# Patient Record
Sex: Female | Born: 1950 | Race: White | Hispanic: No | Smoking: Never smoker
Health system: Southern US, Community
[De-identification: ages and names within clinical notes are randomized; demographics above are authoritative.]

## PROBLEM LIST (undated history)

## (undated) DIAGNOSIS — C9000 Multiple myeloma not having achieved remission: Secondary | ICD-10-CM

---

## 2014-12-04 ENCOUNTER — Emergency Department (HOSPITAL_COMMUNITY): Payer: Medicare Other

## 2014-12-04 ENCOUNTER — Encounter (HOSPITAL_COMMUNITY): Payer: Self-pay | Admitting: *Deleted

## 2014-12-04 ENCOUNTER — Emergency Department (HOSPITAL_COMMUNITY)
Admission: EM | Admit: 2014-12-04 | Discharge: 2014-12-04 | Disposition: A | Discharge status: Home / Self Care | Payer: Medicare Other | Attending: Emergency Medicine | Admitting: Emergency Medicine

## 2014-12-04 DIAGNOSIS — Z8579 Personal history of other malignant neoplasms of lymphoid, hematopoietic and related tissues: Secondary | ICD-10-CM | POA: Diagnosis not present

## 2014-12-04 DIAGNOSIS — Z7982 Long term (current) use of aspirin: Secondary | ICD-10-CM | POA: Diagnosis not present

## 2014-12-04 DIAGNOSIS — R140 Abdominal distension (gaseous): Secondary | ICD-10-CM | POA: Diagnosis not present

## 2014-12-04 DIAGNOSIS — N390 Urinary tract infection, site not specified: Secondary | ICD-10-CM | POA: Diagnosis not present

## 2014-12-04 DIAGNOSIS — Z79899 Other long term (current) drug therapy: Secondary | ICD-10-CM | POA: Insufficient documentation

## 2014-12-04 DIAGNOSIS — R350 Frequency of micturition: Secondary | ICD-10-CM | POA: Diagnosis present

## 2014-12-04 HISTORY — DX: Multiple myeloma not having achieved remission: C90.00

## 2014-12-04 LAB — CBC WITH DIFFERENTIAL/PLATELET
Basophils Absolute: 0.1 10*3/uL (ref 0.0–0.1)
Basophils Relative: 1 %
EOS ABS: 0.2 10*3/uL (ref 0.0–0.7)
EOS PCT: 3 %
HCT: 34.3 % — ABNORMAL LOW (ref 36.0–46.0)
Hemoglobin: 11.6 g/dL — ABNORMAL LOW (ref 12.0–15.0)
LYMPHS ABS: 0.4 10*3/uL — AB (ref 0.7–4.0)
Lymphocytes Relative: 8 %
MCH: 33.9 pg (ref 26.0–34.0)
MCHC: 33.8 g/dL (ref 30.0–36.0)
MCV: 100.3 fL — ABNORMAL HIGH (ref 78.0–100.0)
MONO ABS: 0.9 10*3/uL (ref 0.1–1.0)
Monocytes Relative: 15 %
Neutro Abs: 4.4 10*3/uL (ref 1.7–7.7)
Neutrophils Relative %: 75 %
PLATELETS: 166 10*3/uL (ref 150–400)
RBC: 3.42 MIL/uL — AB (ref 3.87–5.11)
RDW: 16 % — AB (ref 11.5–15.5)
WBC: 5.9 10*3/uL (ref 4.0–10.5)

## 2014-12-04 LAB — URINALYSIS, ROUTINE W REFLEX MICROSCOPIC
Bilirubin Urine: NEGATIVE
Glucose, UA: NEGATIVE mg/dL
Ketones, ur: NEGATIVE mg/dL
Nitrite: NEGATIVE
PROTEIN: 30 mg/dL — AB
Specific Gravity, Urine: 1.01 (ref 1.005–1.030)
UROBILINOGEN UA: 0.2 mg/dL (ref 0.0–1.0)
pH: 7 (ref 5.0–8.0)

## 2014-12-04 LAB — COMPREHENSIVE METABOLIC PANEL
ALT: 16 U/L (ref 14–54)
ANION GAP: 7 (ref 5–15)
AST: 19 U/L (ref 15–41)
Albumin: 3.8 g/dL (ref 3.5–5.0)
Alkaline Phosphatase: 55 U/L (ref 38–126)
BUN: 12 mg/dL (ref 6–20)
CHLORIDE: 103 mmol/L (ref 101–111)
CO2: 28 mmol/L (ref 22–32)
Calcium: 8.3 mg/dL — ABNORMAL LOW (ref 8.9–10.3)
Creatinine, Ser: 0.77 mg/dL (ref 0.44–1.00)
GFR calc non Af Amer: >60 mL/min (ref >60)
Glucose, Bld: 88 mg/dL (ref 65–99)
POTASSIUM: 3.9 mmol/L (ref 3.5–5.1)
SODIUM: 138 mmol/L (ref 135–145)
Total Bilirubin: 0.4 mg/dL (ref 0.3–1.2)
Total Protein: 6.4 g/dL — ABNORMAL LOW (ref 6.5–8.1)

## 2014-12-04 LAB — URINE MICROSCOPIC-ADD ON

## 2014-12-04 MED ORDER — CEPHALEXIN 500 MG PO CAPS: 500.0000 mg | ORAL_CAPSULE | Freq: Four times a day (QID) | ORAL | Status: AC

## 2014-12-04 MED ORDER — CEPHALEXIN 500 MG PO CAPS
500.0000 mg | ORAL_CAPSULE | Freq: Once | ORAL | Status: AC
  Administered 2014-12-04: 500 mg via ORAL
  Filled 2014-12-04: qty 1

## 2014-12-04 NOTE — ED Provider Notes (Signed)
CSN: [id number]     Arrival date & time 12/04/14  7902 History   First MD Initiated Contact with Patient 12/04/14 [phone]     Chief Complaint  Patient presents with  . Urinary Frequency  . Abdominal Pain     (Consider location/radiation/quality/duration/timing/severity/associated sxs/prior Treatment) The history is provided by the patient.   Molly Serrano is a 64 y.o. female who is visiting here from [place] for a wedding, presenting with a 3 day history of abdominal pain.  She describes having mild dysuria and increased urinary frequency but also has complaints of lower abdominal cramping pain, mild distention which has been vaccine and waning in character.  Her history significant for multiple myeloma for which she takes Pomalyst by mouth daily for 3 weeks then 1 week off and is currently halfway through her three-week treatment.  She reports mild nausea on her off week, but states she's been nauseated this past week which is unusual and had one episode of vomiting yesterday.  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  She denies constipation, had 3 small bowel movements yesterday which were well formed.  Appetite has been fair.  Has found no alleviators for her symptoms.     Past Medical History  Diagnosis Date  . Multiple myeloma ([place])    History reviewed. No pertinent past surgical history. No family history on file. Social History  Substance Use Topics  . Smoking status: Never Smoker   . Smokeless tobacco: None  . Alcohol Use: Yes     Comment: occ.    OB History    No data available     Review of Systems  Constitutional: Negative for fever and chills.  HENT: Negative for congestion and sore throat.   Eyes: Negative.   Respiratory: Negative for chest tightness and shortness of breath.   Cardiovascular: Negative for chest  pain.  Gastrointestinal: Positive for nausea, vomiting and abdominal pain. Negative for diarrhea.  Genitourinary: Positive for dysuria, urgency and frequency. Negative for hematuria.  Musculoskeletal: Negative for joint swelling, arthralgias and neck pain.  Skin: Negative.  Negative for rash and wound.  Neurological: Negative for dizziness, weakness, light-headedness, numbness and headaches.  Psychiatric/Behavioral: Negative.       Allergies  Review of patient's allergies indicates no known allergies.  Home Medications   Prior to Admission medications   Medication Sig Start Date End Date Taking? Authorizing Provider  aspirin EC 81 MG tablet Take 81 mg by mouth daily.   Yes Historical Provider, MD  CALCIUM PO Take 1 tablet by mouth daily.   Yes Historical Provider, MD  fentaNYL (DURAGESIC - DOSED MCG/HR) 50 MCG/HR APP 1 PATCH TO SKIN Q 48 H 12/01/14  Yes Historical Provider, MD  Multiple Vitamins-Minerals (CENTRUM PO) Take 1 tablet by mouth daily.   Yes Historical Provider, MD  Omega-3 Fatty Acids (FISH OIL PO) Take 1 tablet by mouth daily.   Yes Historical Provider, MD  POMALYST 3 MG capsule  11/18/14  Yes Historical Provider, MD  sertraline (ZOLOFT) 100 MG tablet TK 1 T PO QD 09/02/14  Yes Historical Provider, MD  valACYclovir (VALTREX) 500 MG tablet TK 1 T PO QD 11/01/14  Yes Historical Provider, MD  zolendronic acid (ZOMETA) 4 MG/5ML injection Inject 4 mg into the vein every 30 (thirty) days.   Yes Historical Provider, MD  zolpidem (AMBIEN) 5 MG  tablet TK 1 T PO QD HS PRN 08/31/14  Yes Historical Provider, MD  cephALEXin (KEFLEX) 500 MG capsule Take 1 capsule (500 mg total) by mouth 4 (four) times daily. 12/04/14   Evalee Jefferson, PA-C   BP 111/54 mmHg  Pulse 61  Temp(Src) 98.3 F (36.8 C) (Oral)  Resp 18  Ht _0  (1.6 m)  Wt 157 lb 12.8 oz (71.578 kg)  BMI 27.96 kg/m2  SpO2 96% Physical Exam  Constitutional: She appears well-developed and well-nourished.  HENT:  Head:  Normocephalic and atraumatic.  Eyes: Conjunctivae are normal.  Neck: Normal range of motion.  Cardiovascular: Normal rate, regular rhythm, normal heart sounds and intact distal pulses.   Pulmonary/Chest: Effort normal and breath sounds normal. She has no wheezes.  Abdominal: Soft. Bowel sounds are normal. She exhibits distension. She exhibits no mass. There is tenderness. There is no rebound and no guarding.  Mild bilateral lower quadrant tenderness without guarding or rebound, no mass.  No suprapubic tenderness.  She has slightly increased tympany to percussion in all 4 quadrants.  Normoactive bowel sounds.  Musculoskeletal: Normal range of motion.  Neurological: She is alert.  Skin: Skin is warm and dry.  Psychiatric: She has a normal mood and affect.  Nursing note and vitals reviewed.   ED Course  Procedures (including critical care time) Labs Review Labs Reviewed  URINALYSIS, ROUTINE W REFLEX MICROSCOPIC (NOT AT [facility]) - Abnormal; Notable for the following:    APPearance CLOUDY (*)    Hgb urine dipstick LARGE (*)    Protein, ur 30 (*)    Leukocytes, UA SMALL (*)    All other components within normal limits  URINE MICROSCOPIC-ADD ON - Abnormal; Notable for the following:    Bacteria, UA FEW (*)    All other components within normal limits  CBC WITH DIFFERENTIAL/PLATELET - Abnormal; Notable for the following:    RBC 3.42 (*)    Hemoglobin 11.6 (*)    HCT 34.3 (*)    MCV 100.3 (*)    RDW 16.0 (*)    Lymphs Abs 0.4 (*)    All other components within normal limits  COMPREHENSIVE METABOLIC PANEL - Abnormal; Notable for the following:    Calcium 8.3 (*)    Total Protein 6.4 (*)    All other components within normal limits    Imaging Review Dg Abd 2 Views  12/04/2014   CLINICAL DATA:  Upper abdominal pain, cramping, nausea and vomiting x2 days ago  EXAM: ABDOMEN - 2 VIEW  COMPARISON:  None.  FINDINGS: Visualized lung bases clear.  No free air.  Normal bowel gas pattern.  Changes  of T9, L2, L4, and L5 kyphoplasty/vertebroplasty. Suspect mild L1 compression deformity.  Bilateral pelvic phleboliths.  IMPRESSION: 1. Normal bowel gas pattern.  No free air. 2. Changes of multilevel thoracolumbar vertebral augmentation. L1 compression deformity, age indeterminate.   Electronically Signed   By: Lucrezia Europe M.D.   On: 12/04/2014 11:22   I have personally reviewed and evaluated these images and lab results as part of my medical decision-making.   EKG Interpretation None      MDM   Final diagnoses:  UTI (lower urinary tract infection)    Patients labs reviewed.  Radiological studies were viewed, interpreted and considered during the medical decision making and disposition process. I agree with radiologists reading.  Results were also discussed with patient.  Pt re-examined prior to dc, still no acute abdominal findings. Suspect sx fully related to uti.  She was placed on keflex, offered pyridium pt deferred.  Advised increased fluid intake, recheck here for any worsened, otw f/u with pcp once abx completed for recheck UA.  Pt understands and agrees with plan.  Discussed with Dr. Roderic Palau prior to dc home.  The patient appears reasonably screened and/or stabilized for discharge and I doubt any other medical condition or other [facility] requiring further screening, evaluation, or treatment in the ED at this time prior to discharge.   Evalee Jefferson, PA-C 12/05/14 1423  Milton Ferguson, MD 12/06/14 1230

## 2014-12-04 NOTE — ED Notes (Signed)
Pt states she is having urinary frequency and odor with urination. Pt states he is having  lower abdominal pain that is described cramping. Pt denies diarrhea but states she has had 1 episode of emesis.

## 2014-12-04 NOTE — Discharge Instructions (Signed)

## 2015-10-04 DEATH — deceased

## 2016-07-23 IMAGING — DX DG ABDOMEN 2V
2 series · 2 of 2 positions shown · non-contrast
Comparison: None.

CLINICAL DATA: Upper abdominal pain, cramping, nausea and vomiting
x2 days ago

EXAM:
ABDOMEN - 2 VIEW

[abdomen erect]
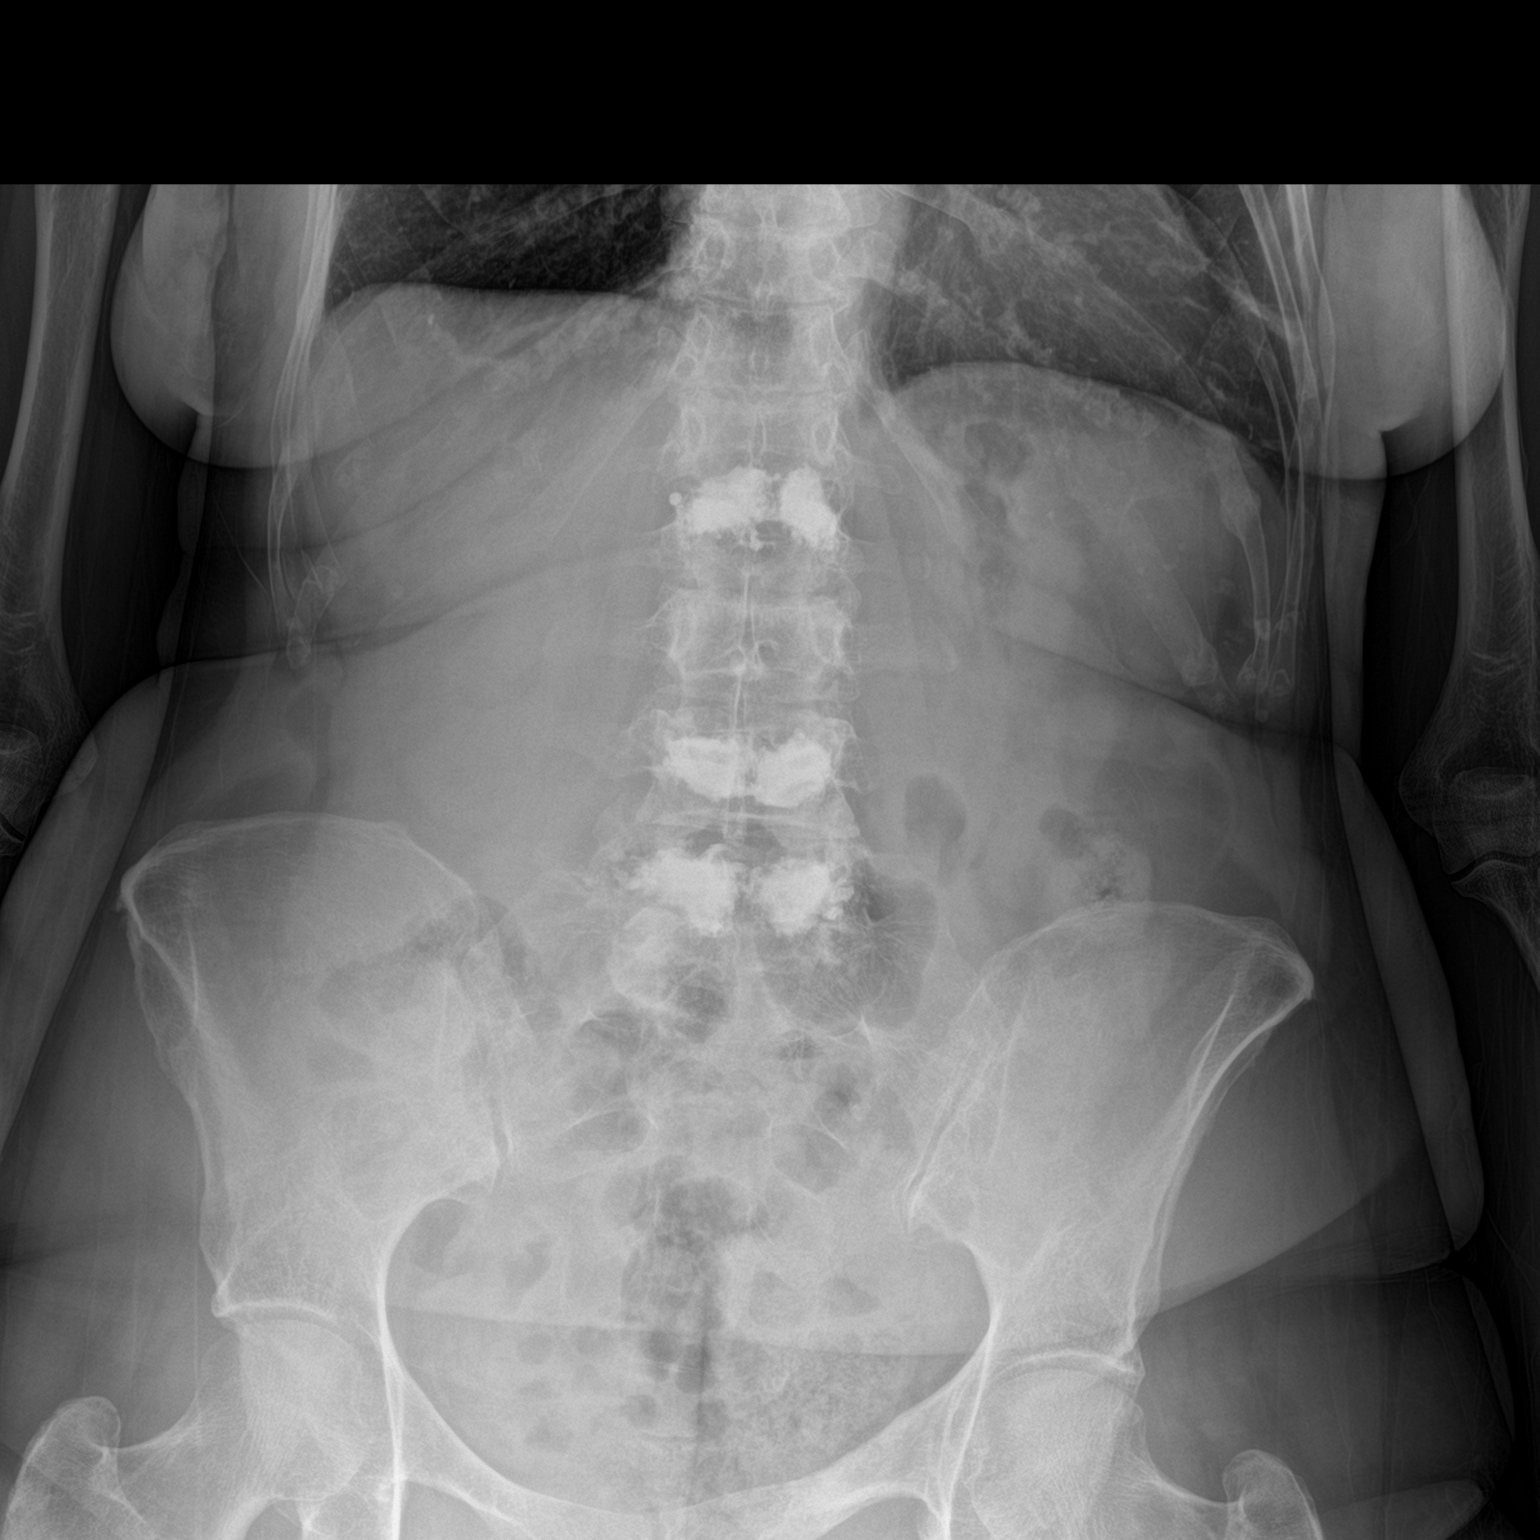

[abdomen supine]
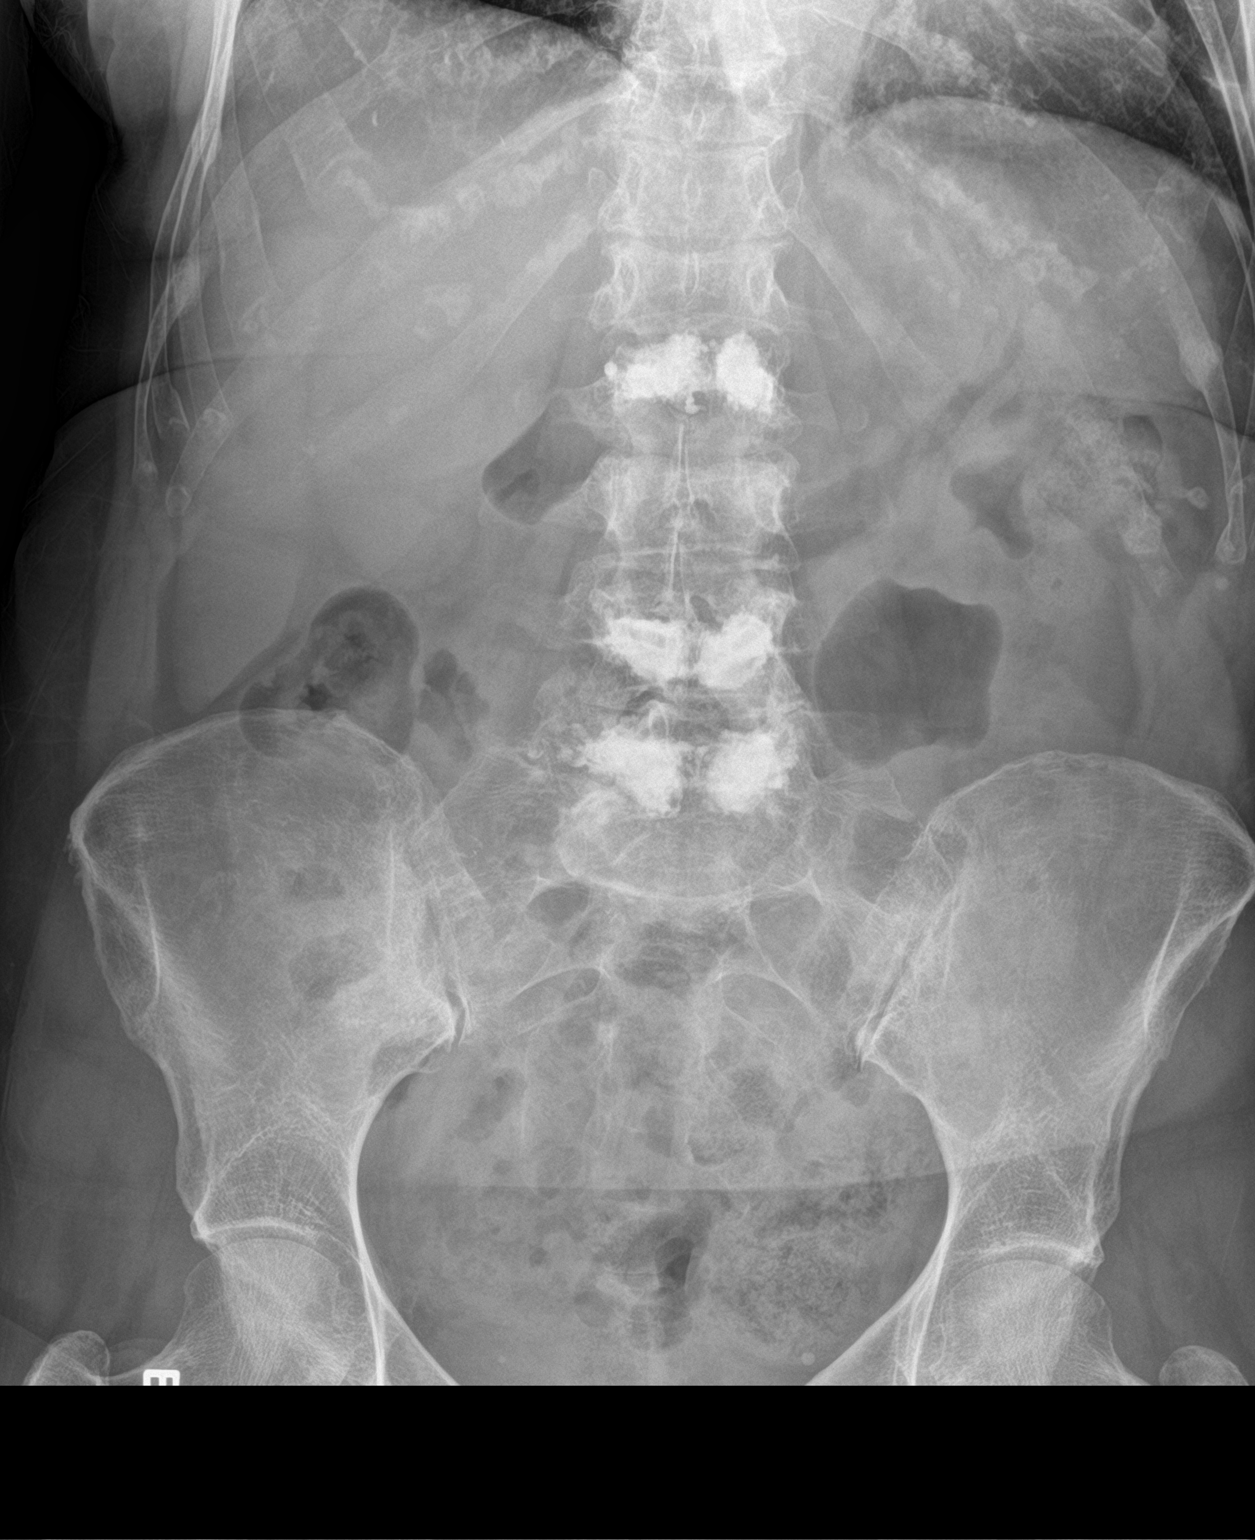

[2 of 2 positions shown; findings below may reference images not displayed]

FINDINGS: Visualized lung bases clear.

No free air.

Normal bowel gas pattern.

Changes of T9, L2, L4, and L5 kyphoplasty/vertebroplasty. Suspect
mild L1 compression deformity.

Bilateral pelvic phleboliths.
IMPRESSION: 1. Normal bowel gas pattern.  No free air.
2. Changes of multilevel thoracolumbar vertebral augmentation. L1
compression deformity, age indeterminate.
